# Patient Record
Sex: Male | Born: 2000 | Race: Black or African American | Hispanic: No | Marital: Single | Smoking: Current every day smoker
Health system: Southern US, Community
[De-identification: ages and names within clinical notes are randomized; demographics above are authoritative.]

## PROBLEM LIST (undated history)

## (undated) DIAGNOSIS — R569 Unspecified convulsions: Secondary | ICD-10-CM

## (undated) DIAGNOSIS — F909 Attention-deficit hyperactivity disorder, unspecified type: Secondary | ICD-10-CM

## (undated) DIAGNOSIS — J302 Other seasonal allergic rhinitis: Secondary | ICD-10-CM

---
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
## 2023-12-11 ENCOUNTER — Other Ambulatory Visit: Payer: Self-pay

## 2023-12-11 ENCOUNTER — Emergency Department (HOSPITAL_BASED_OUTPATIENT_CLINIC_OR_DEPARTMENT_OTHER)
Admission: EM | Admit: 2023-12-11 | Discharge: 2023-12-11 | Disposition: A | Discharge status: Home / Self Care | Payer: 59 | Attending: Emergency Medicine | Admitting: Emergency Medicine

## 2023-12-11 ENCOUNTER — Encounter (HOSPITAL_BASED_OUTPATIENT_CLINIC_OR_DEPARTMENT_OTHER): Payer: Self-pay

## 2023-12-11 DIAGNOSIS — W228XXA Striking against or struck by other objects, initial encounter: Secondary | ICD-10-CM | POA: Insufficient documentation

## 2023-12-11 DIAGNOSIS — S01111A Laceration without foreign body of right eyelid and periocular area, initial encounter: Secondary | ICD-10-CM | POA: Diagnosis not present

## 2023-12-11 DIAGNOSIS — S0990XA Unspecified injury of head, initial encounter: Secondary | ICD-10-CM

## 2023-12-11 DIAGNOSIS — Y9389 Activity, other specified: Secondary | ICD-10-CM | POA: Insufficient documentation

## 2023-12-11 DIAGNOSIS — Y92000 Kitchen of unspecified non-institutional (private) residence as  the place of occurrence of the external cause: Secondary | ICD-10-CM | POA: Insufficient documentation

## 2023-12-11 DIAGNOSIS — S0993XA Unspecified injury of face, initial encounter: Secondary | ICD-10-CM | POA: Diagnosis present

## 2023-12-11 DIAGNOSIS — F17290 Nicotine dependence, other tobacco product, uncomplicated: Secondary | ICD-10-CM | POA: Insufficient documentation

## 2023-12-11 DIAGNOSIS — S0181XA Laceration without foreign body of other part of head, initial encounter: Secondary | ICD-10-CM

## 2023-12-11 MED ORDER — TETANUS-DIPHTH-ACELL PERTUSSIS 5-2.5-18.5 LF-MCG/0.5 IM SUSY
0.5000 mL | PREFILLED_SYRINGE | Freq: Once | INTRAMUSCULAR | Status: DC
  Filled 2023-12-11: qty 0.5

## 2023-12-11 NOTE — Discharge Instructions (Addendum)
It was a pleasure caring for you today in the emergency department.  Please return to the emergency department for any worsening or worrisome symptoms.

## 2023-12-11 NOTE — ED Triage Notes (Signed)
Pt states he was playing & hit his head causing a laceration over his right eye. Bleeding controlled at time of triage without pressure dressing.

## 2023-12-11 NOTE — ED Provider Notes (Signed)
[place] EMERGENCY DEPARTMENT AT [facility] Provider Note  CSN: [id number] Arrival date & time: 12/11/23 0018  Chief Complaint(s) Laceration  HPI Gary Myers is a 23 y.o. male with past medical history as below, significant for ADHD, seasonal allergies, seizures who presents to the ED with complaint of face injury  Patient reports he was playing around his kitchen, ran to his refrigerator and hit his right eyebrow on the refrigerator.  No LOC, no thinners.  Noticed wound to his right eyebrow he reports the ED for evaluation.  Unsure last tetanus shot.  He has no headache.  No nausea or vomiting, no vision changes, no LOC.  No other injuries reported.    Past Medical History Past Medical History:  Diagnosis Date   ADHD (attention deficit hyperactivity disorder)    Seasonal allergies    Seizures (HCC)    There are no active problems to display for this patient.  Home Medication(s) Prior to Admission medications   Medication Sig Start Date End Date Taking? Authorizing Provider  lisdexamfetamine (VYVANSE) 30 MG capsule Take 30 mg by mouth daily.    [provider]  magic mouthwash w/lidocaine SOLN Take 5 mLs by mouth 3 (three) times daily as needed for mouth pain. 09/14/18   Rosezella Rumpf, PA-C  Oxcarbazepine (TRILEPTAL) 300 MG tablet Take 300 mg by mouth 2 (two) times daily.    [provider]                                                                                                                                    Past Surgical History History reviewed. No pertinent surgical history. Family History History reviewed. No pertinent family history.  Social History Social History   Tobacco Use   Smoking status: Every Day    Types: Cigars   Smokeless tobacco: Never  Substance Use Topics   Alcohol use: No   Drug use: No   Allergies Patient has no known allergies.  Review of Systems Review of Systems  Constitutional:   Negative for chills and fever.  Respiratory:  Negative for chest tightness.   Cardiovascular:  Negative for chest pain.  Gastrointestinal:  Negative for abdominal pain.  Skin:  Positive for wound.  Neurological:  Negative for syncope, speech difficulty, light-headedness, numbness and headaches.  All other systems reviewed and are negative.   Physical Exam Vital Signs  I have reviewed the triage vital signs BP (!) 146/71 (BP Location: Left Arm)   Pulse 63   Temp 97.7 F (36.5 C)   Resp 16   Ht 5\' 6"  (1.676 m)   Wt 68 kg   SpO2 100%   BMI 24.21 kg/m  Physical Exam Vitals and nursing note reviewed.  Constitutional:      General: He is not in acute distress.    Appearance: Normal appearance. He is well-developed. He is not ill-appearing.  HENT:  Head: Normocephalic.     Jaw: There is normal jaw occlusion. No trismus.      Right Ear: External ear normal.     Left Ear: External ear normal.     Nose: Nose normal.     Mouth/Throat:     Mouth: Mucous membranes are moist.  Eyes:     General: Vision grossly intact. Gaze aligned appropriately. No scleral icterus.       Right eye: No discharge.        Left eye: No discharge.     Extraocular Movements: Extraocular movements intact.  Cardiovascular:     Rate and Rhythm: Normal rate.  Pulmonary:     Effort: Pulmonary effort is normal. No respiratory distress.     Breath sounds: No stridor.  Abdominal:     General: Abdomen is flat. There is no distension.     Tenderness: There is no guarding.  Musculoskeletal:        General: No deformity.     Cervical back: Full passive range of motion without pain. No rigidity.  Skin:    General: Skin is warm and dry.     Coloration: Skin is not cyanotic, jaundiced or pale.  Neurological:     Mental Status: He is alert and oriented to person, place, and time. Mental status is at baseline.     GCS: GCS eye subscore is 4. GCS verbal subscore is 5. GCS motor subscore is 6.  Psychiatric:         Speech: Speech normal.        Behavior: Behavior normal. Behavior is cooperative.     ED Results and Treatments Labs (all labs ordered are listed, but only abnormal results are displayed) Labs Reviewed - No data to display                                                                                                                        Radiology No results found.  Pertinent labs & imaging results that were available during my care of the patient were reviewed by me and considered in my medical decision making (see MDM for details).  Medications Ordered in ED Medications  Tdap (BOOSTRIX) injection 0.5 mL (0.5 mLs Intramuscular Not Given 12/11/23 0326)  Procedures .Laceration Repair  Date/Time: 12/11/2023 3:19 AM  Performed by: Sloan Leiter, DO Authorized by: Sloan Leiter, DO   Consent:    Consent obtained:  Verbal   Consent given by:  Patient   Risks, benefits, and alternatives were discussed: yes     Risks discussed:  Infection and pain   Alternatives discussed:  No treatment and delayed treatment Universal protocol:    Procedure explained and questions answered to patient or proxy's satisfaction: yes     Patient identity confirmed:  Verbally with patient and arm band Anesthesia:    Anesthesia method:  None Laceration details:    Location:  Face   Face location:  R eyebrow   Length (cm):  1   Depth (mm):  0.5 Exploration:    Limited defect created (wound extended): no     Hemostasis achieved with:  Direct pressure   Imaging outcome: foreign body not noted     Wound exploration: entire depth of wound visualized     Contaminated: no   Treatment:    Area cleansed with:  Shur-Clens   Amount of cleaning:  Standard   Debridement:  None   Undermining:  None Skin repair:    Repair method:  Tissue adhesive Approximation:     Approximation:  Close Repair type:    Repair type:  Simple Post-procedure details:    Dressing:  Open (no dressing)   Procedure completion:  Tolerated well, no immediate complications   (including critical care time)  Medical Decision Making / ED Course    Medical Decision Making:    Gary Myers is a 23 y.o. male with past medical history as below, significant for ADHD, seasonal allergies, seizures who presents to the ED with complaint of face injury. The complaint involves an extensive differential diagnosis and also carries with it a high risk of complications and morbidity.  Serious etiology was considered. Ddx includes but is not limited to: Laceration, abrasion, contusion, etc.  Complete initial physical exam performed, notably the patient was in no distress, sitting upright comfortably on stretcher.    Reviewed and confirmed nursing documentation for past medical history, family history, social history.  Vital signs reviewed.      Brief summary:   23 year old male with history as above here for head injury.  Patient hit his forehead on refrigerator.  He has small laceration to his right eyebrow.  The uppermost aspect of the laceration appears to have an avulsion of the tissue.  Wound approximates well, will closed with Dermabond, see procedure note.  Discussed wound care instructions for home.  Tetanus was updated. Concussion precautions for home.  Patient with minor head injury, no LOC, does not require emergent imaging at this time.  Mild concussion is consideration. Follow-up w/ PCP was encouraged  The patient improved significantly and was discharged in stable condition. Detailed discussions were had with the patient/guardian regarding current findings, and need for close f/u with PCP or on call doctor. The patient/guardian has been instructed to return immediately if the symptoms worsen in any way for re-evaluation. Patient/guardian verbalized understanding and is in  agreement with current care plan. All questions answered prior to discharge.  Patient did not receive his AVS as he eloped from the department after my discharge discussion with the patient                  Additional history obtained: -Additional history obtained from family -External records from outside source obtained and reviewed including: Chart  review including previous notes, labs, imaging, consultation notes including  Home medications, prior labs and imaging   Lab Tests: na  EKG   EKG Interpretation Date/Time:    Ventricular Rate:    PR Interval:    QRS Duration:    QT Interval:    QTC Calculation:   R Axis:      Text Interpretation:           Imaging Studies ordered: na   Medicines ordered and prescription drug management: Meds ordered this encounter  Medications   Tdap (BOOSTRIX) injection 0.5 mL    -I have reviewed the patients home medicines and have made adjustments as needed   Consultations Obtained: na   Cardiac Monitoring: Continuous pulse oximetry interpreted by myself, 100% on RA.    Social Determinants of Health:  Diagnosis or treatment significantly limited by social determinants of health: no pcp, tobacco use Counseled patient for approximately 3 minutes regarding smoking cessation. Discussed risks of smoking and how they applied and affected their visit here today. Patient not ready to quit at this time, however will follow up with their primary doctor when they are.  Patient vaping during assessment, recommend he refrain in the future.  CPT code: 16109: intermediate counseling for smoking cessation     Reevaluation: After the interventions noted above, I reevaluated the patient and found that they have resolved  Co morbidities that complicate the patient evaluation  Past Medical History:  Diagnosis Date   ADHD (attention deficit hyperactivity disorder)    Seasonal allergies    Seizures (HCC)        Dispostion: Disposition decision including need for hospitalization was considered, and patient discharged from emergency department.    Final Clinical Impression(s) / ED Diagnoses Final diagnoses:  Facial laceration, initial encounter  Minor head injury, initial encounter        Sloan Leiter, DO 12/11/23 6045
